# Patient Record
Sex: Female | Born: 1990 | Race: White | Hispanic: No | Marital: Married | Smoking: Never smoker
Health system: Southern US, Community
[De-identification: ages and names within clinical notes are randomized; demographics above are authoritative.]

## PROBLEM LIST (undated history)

## (undated) HISTORY — PX: TONSILLECTOMY: SUR1361

---

## 2019-07-20 ENCOUNTER — Ambulatory Visit (INDEPENDENT_AMBULATORY_CARE_PROVIDER_SITE_OTHER): Payer: Self-pay

## 2019-07-20 ENCOUNTER — Ambulatory Visit
Admission: EM | Admit: 2019-07-20 | Discharge: 2019-07-20 | Disposition: A | Discharge status: Home / Self Care | Payer: Self-pay

## 2019-07-20 ENCOUNTER — Other Ambulatory Visit: Payer: Self-pay

## 2019-07-20 DIAGNOSIS — R002 Palpitations: Secondary | ICD-10-CM

## 2019-07-20 DIAGNOSIS — R0602 Shortness of breath: Secondary | ICD-10-CM

## 2019-07-20 DIAGNOSIS — M546 Pain in thoracic spine: Secondary | ICD-10-CM

## 2019-07-20 NOTE — ED Provider Notes (Signed)
[facility]   [id number] 07/20/19 Arrival Time: 1028   Chief Complaint  Patient presents with  . Back Pain     SUBJECTIVE: History from: patient.  Sarah Arellano is a 29 y.o. female who presented to the urgent care for complaint of acute on chronic thoracic back pain, shortness of breath and palpitation for the past years.  Report symptom has been getting worse the last 3 days.  Denies any precipitating event.  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  She localizes the pain to the thoracic mid back.  She describes the pain as constant and achy.  Has seen PCP or visit if symptom and was prescribed Protonix for possible GERD.  Reports symptom has not resolved.  Has not seen her PCP yet this year.  Denies chills, fever, nausea, vomiting, diarrhea, chest pain, chest tightness, confusion.  ROS: As per HPI.  All other pertinent ROS negative.      History reviewed. No pertinent past medical history. Past Surgical History:  Procedure Laterality Date  . CESAREAN SECTION    . TONSILLECTOMY     No Known Allergies No current facility-administered medications on file prior to encounter.   Current Outpatient Medications on File Prior to Encounter  Medication Sig Dispense Refill  . FLUoxetine (PROZAC) 20 MG tablet Take 20 mg by mouth daily.     Social History   Socioeconomic History  . Marital status: Married    Spouse name: Not on file  . Number of children: Not on file  . Years of education: Not on file  . Highest education level: Not on file  Occupational History  . Not on file  Tobacco Use  . Smoking status: Never Smoker  . Smokeless tobacco: Never Used  Substance and Sexual Activity  . Alcohol use: Not on file  . Drug use: Not on file  . Sexual activity: Not on file  Other Topics Concern  . Not on file  Social History Narrative  . Not on file   Social Determinants of Health   Financial Resource Strain:   . Difficulty of Paying Living Expenses:     Food Insecurity:   . Worried About Programme researcher, broadcasting/film/video in the Last Year:   . Barista in the Last Year:   Transportation Needs:   . Freight forwarder (Medical):   Marland Kitchen Lack of Transportation (Non-Medical):   Physical Activity:   . Days of Exercise per Week:   . Minutes of Exercise per Session:   Stress:   . Feeling of Stress :   Social Connections:   . Frequency of Communication with Friends and Family:   . Frequency of Social Gatherings with Friends and Family:   . Attends Religious Services:   . Active Member of Clubs or Organizations:   . Attends Banker Meetings:   Marland Kitchen Marital Status:   Intimate Partner Violence:   . Fear of Current or Ex-Partner:   . Emotionally Abused:   Marland Kitchen Physically Abused:   . Sexually Abused:    Family History  Problem Relation Age of Onset  . Hypertension Father     OBJECTIVE:  Vitals:   07/20/19 1035  BP: 107/72  Pulse: 69  Resp: 12  Temp: 98.6 F (37 C)  TempSrc: Oral  SpO2: 98%     Physical Exam Vitals and nursing note reviewed.  Constitutional:      General: She is not in acute distress.  Appearance: Normal appearance. She is normal weight. She is not ill-appearing, toxic-appearing or diaphoretic.  Cardiovascular:     Rate and Rhythm: Normal rate and regular rhythm.     Pulses: Normal pulses.     Heart sounds: Normal heart sounds. No murmur heard.  No friction rub. No gallop.   Pulmonary:     Effort: Pulmonary effort is normal. No respiratory distress.     Breath sounds: Normal breath sounds. No stridor. No wheezing, rhonchi or rales.  Chest:     Chest wall: No tenderness.  Abdominal:     General: Abdomen is flat.     Tenderness: There is no right CVA tenderness or left CVA tenderness.  Musculoskeletal:        General: Tenderness present.     Cervical back: Normal.     Thoracic back: Swelling and spasms present.     Comments: Thoracic back:  Patient ambulates from chair to exam table without  difficulty.  Inspection: Skin clear and intact without obvious swelling, erythema, or ecchymosis. Warm to the touch  Palpation: Vertebral processes nontender. Tenderness about the lower left paravertebral muscles     Neurological:     Mental Status: She is alert.     LABS:  No results found for this or any previous visit (from the past 24 hour(s)).   ASSESSMENT & PLAN:  1. Acute bilateral thoracic back pain   2. Shortness of breath   3. Heart palpitations     No orders of the defined types were placed in this encounter.  Patient stable at discharge.  Thoracic back x-ray is negative for bony abnormality including fracture or dislocation.  I have reviewed the x-ray myself and the radiologist interpretation.  I am in agreement with the radiologist interpretation.  Her symptom is likely from costochondritis.  Was advised to take OTC Tylenol/ibuprofen as needed for pain.  Was advised to follow-up with PCP for possible referral to cardiologist due to heart palpitation and shortness of breath.  Discharge instructions  Get plenty of rest and push fluids Follow-up with PCP for possible referral to cardiology Use OTC medications like ibuprofen or tylenol as needed fever or pain Call or go to the ED if you have any new or worsening symptoms such as fever, worsening cough, shortness of breath, chest tightness, chest pain, turning blue, changes in mental status, etc...   Reviewed expectations re: course of current medical issues. Questions answered. Outlined signs and symptoms indicating need for more acute intervention. Patient verbalized understanding. After Visit Summary given.      Note: This document was prepared using Dragon voice recognition software and may include unintentional dictation errors.      Durward Parcel, FNP 07/20/19 1223

## 2019-07-20 NOTE — Discharge Instructions (Addendum)
Get plenty of rest and push fluids Follow-up with PCP for possible referral to cardiology Use OTC medications like ibuprofen or tylenol as needed fever or pain Call or go to the ED if you have any new or worsening symptoms such as fever, worsening cough, shortness of breath, chest tightness, chest pain, turning blue, changes in mental status, etc..Marland Kitchen

## 2019-07-20 NOTE — ED Triage Notes (Signed)
Patient states that for years it has been difficult to get a deep breath. It has worsened over the last 3 days, so she now feels short of breath on exertion and while talking. Complains of hx of years of rib/back pain with it worsening the last 4 days. Also reports years of fatigue and joint pain. Noticed 6-7 days of purple rash on back/hips/thighs, denies itching, pain, burning.

## 2022-02-19 IMAGING — DX DG THORACIC SPINE 2V
2 series · 2 of 2 positions shown · non-contrast
Comparison: None.

CLINICAL DATA: Thoracic spine pain.  No known injury.

EXAM:
THORACIC SPINE 2 VIEWS

[thoracic spine ap]
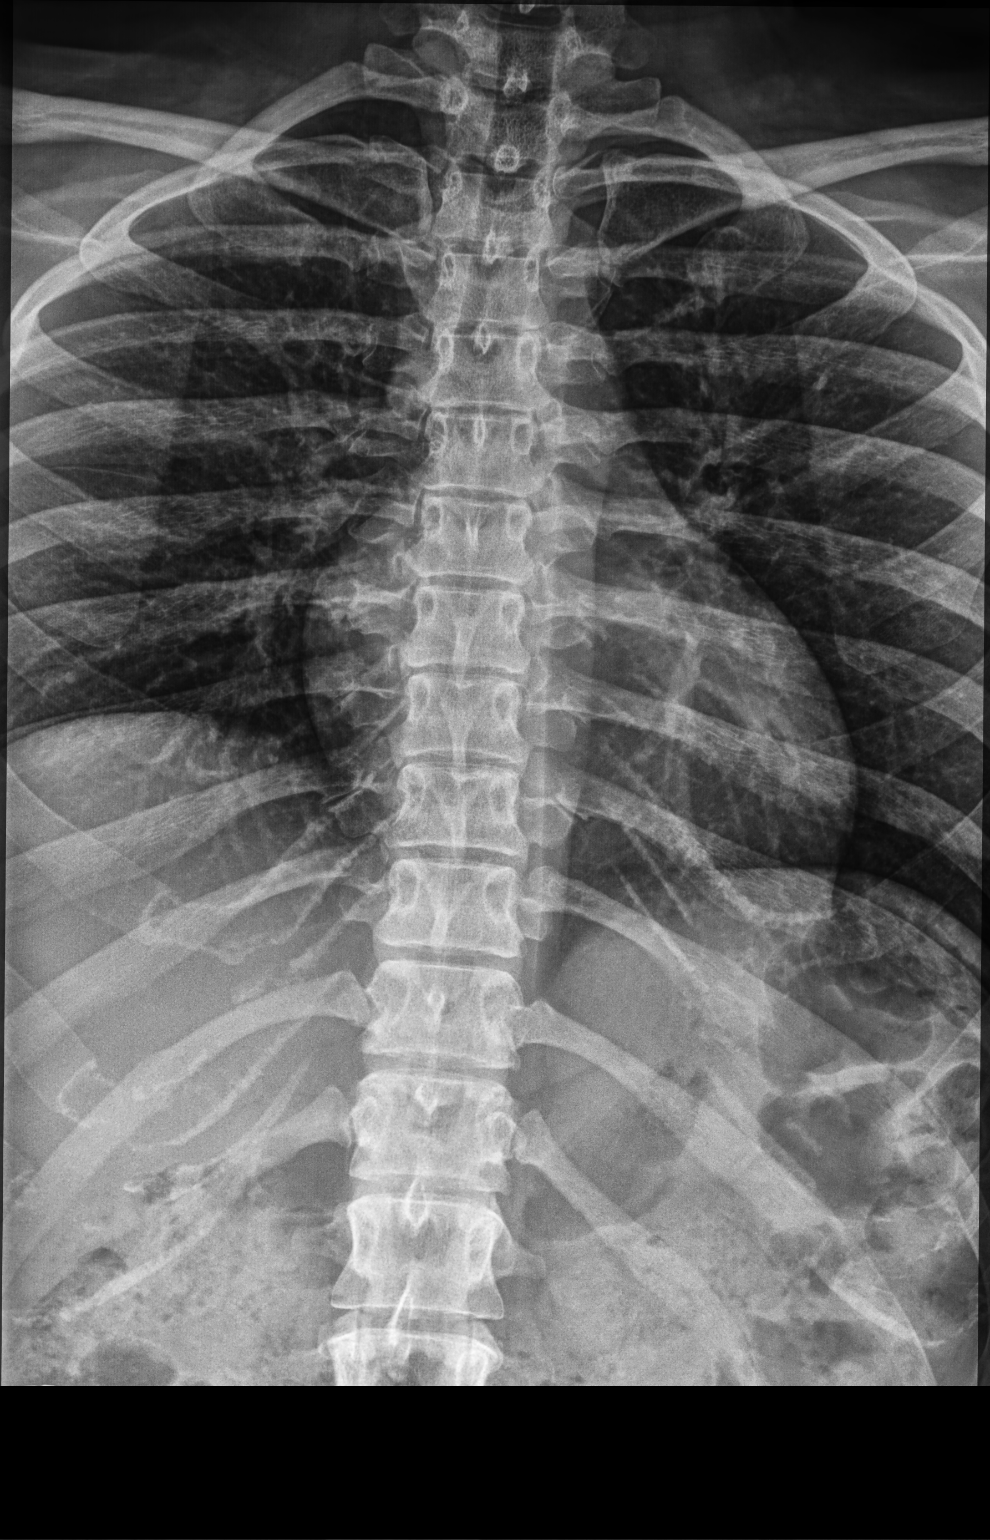

[thoracic spine standing lat]
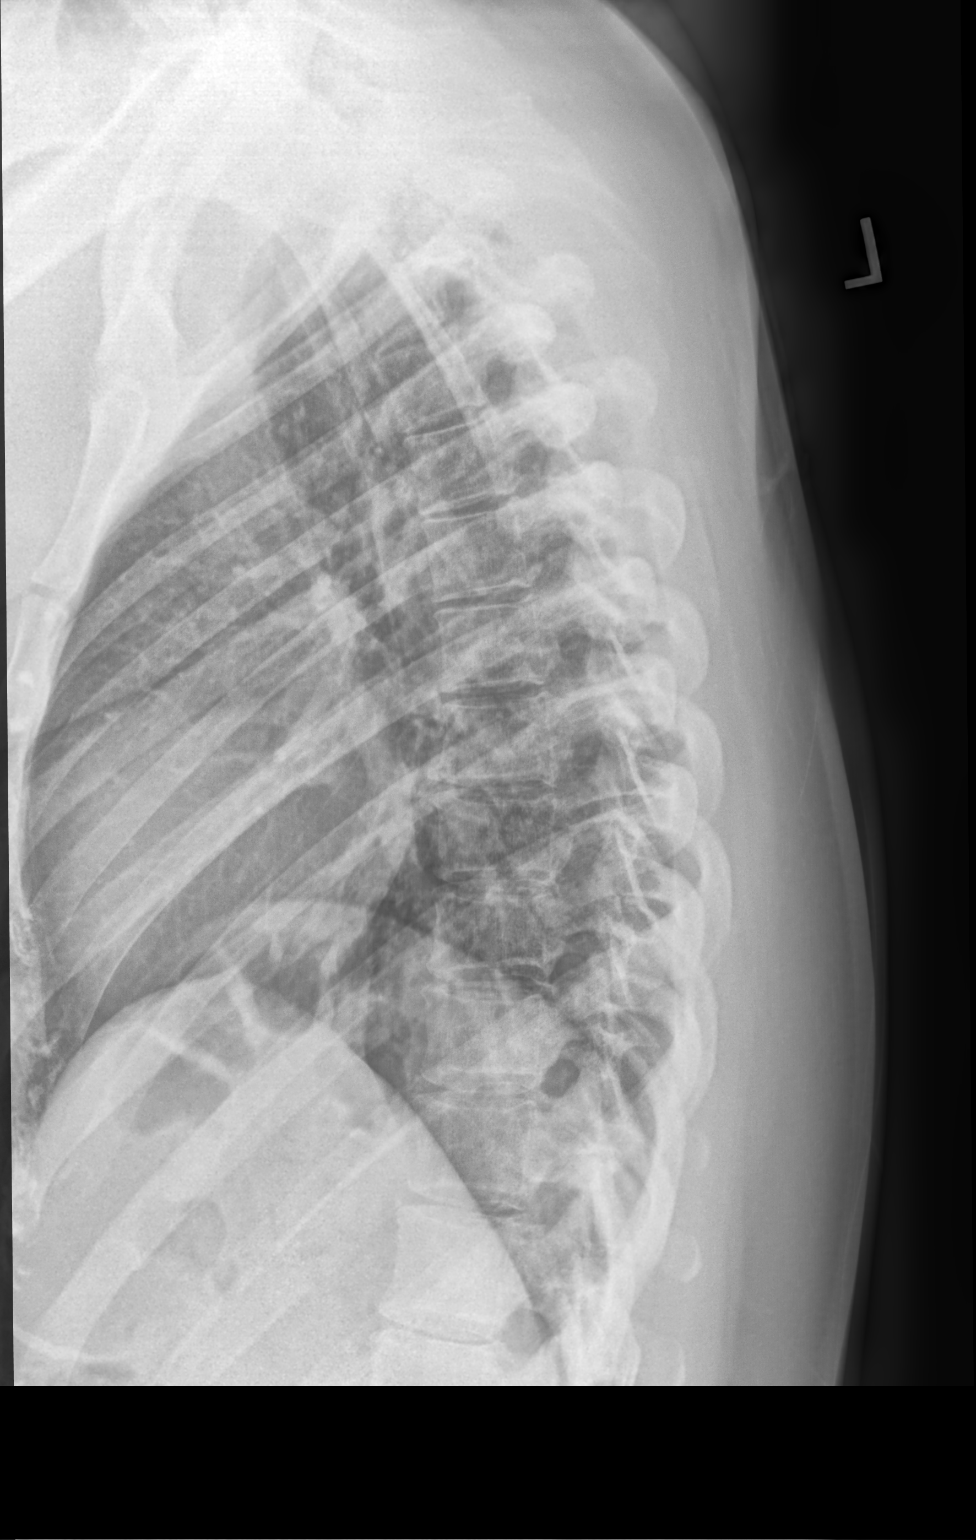

[2 of 2 positions shown; findings below may reference images not displayed]

FINDINGS: There is no evidence of thoracic spine fracture. Alignment is
normal. No other significant bone abnormalities are identified.
IMPRESSION: Normal exam.

## 2022-03-19 NOTE — ED Triage Notes (Signed)
 Extremely tired 2 weeks; left neck and shoulder asleep and achy tonight

## 2023-08-25 ENCOUNTER — Encounter: Payer: Self-pay | Admitting: Internal Medicine

## 2023-08-25 ENCOUNTER — Encounter (INDEPENDENT_AMBULATORY_CARE_PROVIDER_SITE_OTHER): Payer: Self-pay | Admitting: *Deleted

## 2023-09-06 ENCOUNTER — Encounter (INDEPENDENT_AMBULATORY_CARE_PROVIDER_SITE_OTHER): Payer: Self-pay | Admitting: Gastroenterology

## 2023-09-06 ENCOUNTER — Ambulatory Visit (INDEPENDENT_AMBULATORY_CARE_PROVIDER_SITE_OTHER): Admitting: Gastroenterology

## 2023-09-06 VITALS — BP 111/72 | HR 75 | Temp 98.2°F | Ht 63.0 in | Wt 181.4 lb

## 2023-09-06 DIAGNOSIS — R1013 Epigastric pain: Secondary | ICD-10-CM | POA: Insufficient documentation

## 2023-09-06 DIAGNOSIS — R10829 Rebound abdominal tenderness, unspecified site: Secondary | ICD-10-CM | POA: Diagnosis not present

## 2023-09-06 DIAGNOSIS — K591 Functional diarrhea: Secondary | ICD-10-CM

## 2023-09-06 DIAGNOSIS — D1809 Hemangioma of other sites: Secondary | ICD-10-CM

## 2023-09-06 DIAGNOSIS — R140 Abdominal distension (gaseous): Secondary | ICD-10-CM | POA: Insufficient documentation

## 2023-09-06 DIAGNOSIS — K5900 Constipation, unspecified: Secondary | ICD-10-CM | POA: Diagnosis not present

## 2023-09-06 DIAGNOSIS — R197 Diarrhea, unspecified: Secondary | ICD-10-CM | POA: Insufficient documentation

## 2023-09-06 DIAGNOSIS — R112 Nausea with vomiting, unspecified: Secondary | ICD-10-CM | POA: Insufficient documentation

## 2023-09-06 DIAGNOSIS — K219 Gastro-esophageal reflux disease without esophagitis: Secondary | ICD-10-CM

## 2023-09-06 MED ORDER — PANTOPRAZOLE SODIUM 40 MG PO TBEC: 40.0000 mg | DELAYED_RELEASE_TABLET | Freq: Every day | ORAL | 1 refills | Status: AC

## 2023-09-06 NOTE — Patient Instructions (Signed)
-  stop famotidine -start protonix  40mg  daily  -schedule EGD  -celiac panel  -will consider surgical evaluation for gallbladder if labs/EGD are unremarkable  -I will obtain copies of HIDA scan, US  and CT for further review, however, no further evaluation needed for hemangiomas on the liver as these are benign -try to follow mammal/dairy free diet in regards to history of alpha gal  Follow up 3 months  It was a pleasure to see you today. I want to create trusting relationships with patients and provide genuine, compassionate, and quality care. I truly value your feedback! please be on the lookout for a survey regarding your visit with me today. I appreciate your input about our visit and your time in completing this!    Bailynn Dyk L. Pedro Whiters, MSN, APRN, AGNP-C Adult-Gerontology Nurse Practitioner [facility] at [facility]

## 2023-09-06 NOTE — Progress Notes (Signed)
 Referring Provider: Henriette Anes, DO Primary Care Physician:  Henriette Anes, DO Primary GI Physician: new (Dr. Cinderella)  Chief Complaint  Patient presents with   Liver Mass    Pt arrives for liver mass. Pt had CT scan completed in July(records under Media with Referral). Pt having mid abdominal pain constantly. Alternates between constipation and diarrhea. Gallbladder testing completed; HIDA scan done also. Pt also having nausea and vomiting for about 2 months.    HPI:   Sarah Arellano is a 33 y.o. female with past medical history of anxiety/depression, SVT, alpha gal allergy  Patient presenting today for:  Epigastric pain, GERD, nausea, constipation and diarrhea, liver lesion  CT A/P with contrast;07/2023 two 2cm benign appearing hemangiomas  US  in June concerning for hemangioma (small hyperechoic mass in R liver lobe), GB unremarkable, no stones or sludge, liver 13.7 cm  HIDA scan was done in July/or August was normal per patient (47% EF) CBC results provided by patient is normal with no anemia  CMP unremarkable, LFTs WNL   Present:  States that she has history of ongoing reflux. She also notes some pain in her chest that goes into her Right shoulder, she has nausea and vomiting almost all the time. She notes that GERD and pain in her shoulder have been ongoing for about 2 months. She also has some epigastric pain. Eating seems to make her symptoms worse almost anytime she eats. She notes that certain foods may make it worse but pain is almost always there. She avoids eating a lot due to pain. Eating makes her nausea worse. She endorses epigastric pain worsens at times, can be sharp stabbing at its worst. She denies obvious rectal bleeding or melena but also endorses she does not really pay attention to her stools. She endorses ongoing diarrhea and constipation that alternates for some time. She endorses bloating and some early satiety as well. She endorses frequent heartburn and acid  regurgitation, she is taking otc famotidine but unsure if this helps or not. She took omeprazole but did not feel this helped much so she stopped it. Reports she was told to take iron a few years ago but is not currently taking it. Her mother at visit also reports patient has history of alpha gal allergy, she does not stick to completely mammal/dairy free diet.   NSAID use: none  Social hx: maybe 4 margaritas per week, no smoking  Fam hx: no CRC, pancreatic cancer or liver cancer   Last Colonoscopy: never  Last Endoscopy: never   [employer]   09/06/23 1600  Weight: 181 lb 6.4 oz (82.3 kg)     History reviewed. No pertinent past medical history.  Past Surgical History:  Procedure Laterality Date   CESAREAN SECTION     TONSILLECTOMY      Current Outpatient Medications  Medication Sig Dispense Refill   FLUoxetine (PROZAC) 40 MG capsule Take by mouth daily.     No current facility-administered medications for this visit.    Allergies as of 09/06/2023   (No Known Allergies)    Social History   Socioeconomic History   Marital status: Married    Spouse name: Not on file   Number of children: Not on file   Years of education: Not on file   Highest education level: Not on file  Occupational History   Not on file  Tobacco Use   Smoking status: Never   Smokeless tobacco: Never  Vaping Use   Vaping status: Never Used  Substance and Sexual Activity   Alcohol use: Yes    Comment: Rare   Drug use: Never   Sexual activity: Not on file  Other Topics Concern   Not on file  Social History Narrative   Not on file   Social Drivers of Health   Financial Resource Strain: Not on file  Food Insecurity: Not on file  Transportation Needs: Not on file  Physical Activity: Not on file  Stress: Not on file  Social Connections: Not on file    Review of systems General: negative for malaise, night sweats, fever, chills, weight loss Neck: Negative for lumps, goiter, pain and  significant neck swelling Resp: Negative for cough, wheezing, dyspnea at rest CV: Negative for chest pain, leg swelling, palpitations, orthopnea GI: denies melena, hematochezia, dysphagia, odyonophagia, early satiety or unintentional weight loss. +epigastric pain +GERD +nausea/vomiting +diarrhea +constipation  MSK: Negative for joint pain or swelling, back pain, and muscle pain. Derm: Negative for itching or rash Psych: Denies depression, anxiety, memory loss, confusion. No homicidal or suicidal ideation.  Heme: Negative for prolonged bleeding, bruising easily, and swollen nodes. Endocrine: Negative for cold or heat intolerance, polyuria, polydipsia and goiter. Neuro: negative for tremor, gait imbalance, syncope and seizures. The remainder of the review of systems is noncontributory.  Physical Exam: BP 111/72   Pulse 75   Temp 98.2 F (36.8 C)   Ht 5' 3 (1.6 m)   Wt 181 lb 6.4 oz (82.3 kg)   LMP 08/18/2023 (Approximate)   BMI 32.13 kg/m  General:   Alert and oriented. No distress noted. Pleasant and cooperative.  Head:  Normocephalic and atraumatic. Eyes:  Conjuctiva clear without scleral icterus. Mouth:  Oral mucosa pink and moist. Good dentition. No lesions. Heart: Normal rate and rhythm, s1 and s2 heart sounds present.  Lungs: Clear lung sounds in all lobes. Respirations equal and unlabored. Abdomen:  +BS, soft, some tenderness to palpation of epigastric region and notably a small area of palpable cord like tissue upon deep palpation (query scar tissue from previous abdominal surgery as no findings noted on CT or US  regarding this) liver does not feel enlarged/ is non palpable. abdomen non-distended. No rebound or guarding. No HSM or masses noted. Derm: No palmar erythema or jaundice Msk:  Symmetrical without gross deformities. Normal posture. Extremities:  Without edema. Neurologic:  Alert and  oriented x4 Psych:  Alert and cooperative. Normal mood and affect.  Invalid  input(s): 6 MONTHS   ASSESSMENT: Sarah Arellano is a 33 y.o. female presenting today as a new patient for epigastric pain, nausea/vomiting, diarrhea, constipation and GERD, liver lesion  Epigastric pain/nausea/vomiting: recent CT A/P, US  and HIDA scan which showed normal GB function and no evidence of GB wall thickening or concerning findings for cholecystitis. Notably she endorses R shoulder pain, her clinical presentation is still quite convincing of biliary colic. She does endorse epigastric pain vs. RUQ as well as nausea/vomiting. Given imaging is negative for GB etiology at this time, would recommend further evaluation via EGD to rule out etiology in the UGI tract, if EGD is unremarkable and symptoms persist, may consider referral to general surgeon for evaluation of possible cholecystectomy.   GERD: not well controlled on famotidine, no improvement previously with omeprazole. Notes symptoms almost daily with early satiety at times. No previous EGD. At this time recommend stopping famotidine and starting protonix  40mg  daily, good reflux precautions, as above, pursuing EGD for further evaluation of her UGI symptoms.   Constipation and diarrhea: alternating  pattern of constipation and diarrhea for the last few years,no overt rectal bleeding or melena. She endorses history of alpha gal for which she does not follow a mammal/dairy elimination diet for, therefore her alternating bowel habits could be secondary to this. As she has bloating and abdominal discomfort, will also check celiac panel to rule out this as well. At this time, no indications for colonoscopy as she has no alarm symptoms. Will manage supportively.   Liver lesion(hemangioma): hyperechoic mass seen on US , with follow up CT A/P with contrast showing two 2 cm lesions on R hepatic lobe, consistent with hemangiomas. I discussed this with the patient and her family, no further evaluation or intervention needed as this has no potential to  turn into malignancy. Given size of these, very unlikely this is the source of her pain as hemangiomas are generally asymptomatic unless very large in size.    PLAN:  -stop famotidine -start protonix  40mg  daily  -schedule EGD ASA II -celiac panel  -consider Gen surgery evaluation if EGD unremarkable, celiac testing negative -follow non mammal/dairy diet for alpha gal  All questions were answered, patient verbalized understanding and is in agreement with plan as outlined above.   Follow Up: 3 months   Kellene Mccleary L. Tameyah Koch, MSN, APRN, AGNP-C Adult-Gerontology Nurse Practitioner [facility]

## 2023-09-16 ENCOUNTER — Telehealth: Payer: Self-pay | Admitting: *Deleted

## 2023-09-16 DIAGNOSIS — R112 Nausea with vomiting, unspecified: Secondary | ICD-10-CM

## 2023-09-16 DIAGNOSIS — K219 Gastro-esophageal reflux disease without esophagitis: Secondary | ICD-10-CM

## 2023-09-16 DIAGNOSIS — R1013 Epigastric pain: Secondary | ICD-10-CM

## 2023-09-16 NOTE — Telephone Encounter (Signed)
 Called pt. Scheduled for EGD with Dr. Cinderella 9/24. Aware she needs UPT prior. Instructions to be sent.   PA approved via [employer] Order ID: [id number]       Authorized Approval Valid Through: 09/16/2023 - 11/14/2023

## 2023-09-28 ENCOUNTER — Ambulatory Visit (HOSPITAL_COMMUNITY): Admission: RE | Admit: 2023-09-28 | Source: Home / Self Care | Admitting: Gastroenterology

## 2023-09-28 ENCOUNTER — Encounter (HOSPITAL_COMMUNITY): Admission: RE | Payer: Self-pay | Source: Home / Self Care

## 2023-09-28 SURGERY — EGD (ESOPHAGOGASTRODUODENOSCOPY)
Anesthesia: Choice

## 2023-12-06 ENCOUNTER — Ambulatory Visit (INDEPENDENT_AMBULATORY_CARE_PROVIDER_SITE_OTHER): Admitting: Gastroenterology
